# Patient Record
Sex: Male | Born: 1975 | Race: White | Hispanic: No | Marital: Married | Smoking: Never smoker
Health system: Southern US, Community
[De-identification: ages and names within clinical notes are randomized; demographics above are authoritative.]

---

## 2000-07-05 ENCOUNTER — Ambulatory Visit (HOSPITAL_COMMUNITY): Admission: RE | Admit: 2000-07-05 | Discharge: 2000-07-05 | Payer: Self-pay | Admitting: Orthopedic Surgery

## 2000-07-05 ENCOUNTER — Encounter: Payer: Self-pay | Admitting: Orthopedic Surgery

## 2020-02-07 ENCOUNTER — Other Ambulatory Visit: Payer: Self-pay

## 2020-02-07 DIAGNOSIS — R7401 Elevation of levels of liver transaminase levels: Secondary | ICD-10-CM | POA: Insufficient documentation

## 2020-02-07 DIAGNOSIS — R1013 Epigastric pain: Secondary | ICD-10-CM | POA: Insufficient documentation

## 2020-02-07 DIAGNOSIS — K7581 Nonalcoholic steatohepatitis (NASH): Secondary | ICD-10-CM | POA: Insufficient documentation

## 2020-02-07 NOTE — ED Triage Notes (Signed)
Patient began having abdominal pain Monday, no BM since Tuesday. Patient saw his PCP this morning, and he was referred to GI. Patient states he could not wait for that appointment. Patient complaining of epigastric pain, but is holding his lower abdomen also. No N/V, has not taken anything.

## 2020-02-08 ENCOUNTER — Emergency Department (HOSPITAL_COMMUNITY): Payer: Self-pay

## 2020-02-08 ENCOUNTER — Emergency Department (HOSPITAL_COMMUNITY)
Admission: EM | Admit: 2020-02-08 | Discharge: 2020-02-08 | Disposition: A | Discharge status: Home / Self Care | Payer: Self-pay | Attending: Emergency Medicine | Admitting: Emergency Medicine

## 2020-02-08 ENCOUNTER — Encounter (HOSPITAL_COMMUNITY): Payer: Self-pay | Admitting: Emergency Medicine

## 2020-02-08 DIAGNOSIS — R1013 Epigastric pain: Secondary | ICD-10-CM

## 2020-02-08 DIAGNOSIS — R7401 Elevation of levels of liver transaminase levels: Secondary | ICD-10-CM

## 2020-02-08 DIAGNOSIS — K7581 Nonalcoholic steatohepatitis (NASH): Secondary | ICD-10-CM

## 2020-02-08 LAB — URINALYSIS, ROUTINE W REFLEX MICROSCOPIC
Bacteria, UA: NONE SEEN
Bilirubin Urine: NEGATIVE
Glucose, UA: 50 mg/dL — AB
Hgb urine dipstick: NEGATIVE
Ketones, ur: 5 mg/dL — AB
Leukocytes,Ua: NEGATIVE
Nitrite: NEGATIVE
Protein, ur: 30 mg/dL — AB
Specific Gravity, Urine: 1.015 (ref 1.005–1.030)
pH: 6 (ref 5.0–8.0)

## 2020-02-08 LAB — CBC
HCT: 47.5 % (ref 39.0–52.0)
Hemoglobin: 15.8 g/dL (ref 13.0–17.0)
MCH: 28.1 pg (ref 26.0–34.0)
MCHC: 33.3 g/dL (ref 30.0–36.0)
MCV: 84.4 fL (ref 80.0–100.0)
Platelets: 150 10*3/uL (ref 150–400)
RBC: 5.63 MIL/uL (ref 4.22–5.81)
RDW: 13.2 % (ref 11.5–15.5)
WBC: 13.3 10*3/uL — ABNORMAL HIGH (ref 4.0–10.5)
nRBC: 0 % (ref 0.0–0.2)

## 2020-02-08 LAB — COMPREHENSIVE METABOLIC PANEL
ALT: 512 U/L — ABNORMAL HIGH (ref 0–44)
AST: 92 U/L — ABNORMAL HIGH (ref 15–41)
Albumin: 4.5 g/dL (ref 3.5–5.0)
Alkaline Phosphatase: 164 U/L — ABNORMAL HIGH (ref 38–126)
Anion gap: 9 (ref 5–15)
BUN: 5 mg/dL — ABNORMAL LOW (ref 6–20)
CO2: 28 mmol/L (ref 22–32)
Calcium: 8.8 mg/dL — ABNORMAL LOW (ref 8.9–10.3)
Chloride: 100 mmol/L (ref 98–111)
Creatinine, Ser: 1.03 mg/dL (ref 0.61–1.24)
GFR, Estimated: >60 mL/min (ref >60)
Glucose, Bld: 164 mg/dL — ABNORMAL HIGH (ref 70–99)
Potassium: 4.1 mmol/L (ref 3.5–5.1)
Sodium: 137 mmol/L (ref 135–145)
Total Bilirubin: 1.5 mg/dL — ABNORMAL HIGH (ref 0.3–1.2)
Total Protein: 8.1 g/dL (ref 6.5–8.1)

## 2020-02-08 LAB — HEPATITIS PANEL, ACUTE
HCV Ab: NONREACTIVE
Hep A IgM: NONREACTIVE
Hep B C IgM: NONREACTIVE
Hepatitis B Surface Ag: NONREACTIVE

## 2020-02-08 LAB — LIPASE, BLOOD: Lipase: 27 U/L (ref 11–51)

## 2020-02-08 MED ORDER — MORPHINE SULFATE (PF) 4 MG/ML IV SOLN
4.0000 mg | Freq: Once | INTRAVENOUS | Status: AC
  Administered 2020-02-08: 4 mg via INTRAVENOUS
  Filled 2020-02-08: qty 1

## 2020-02-08 MED ORDER — SODIUM CHLORIDE (PF) 0.9 % IJ SOLN
INTRAMUSCULAR | Status: AC
  Filled 2020-02-08: qty 50

## 2020-02-08 MED ORDER — ALUM & MAG HYDROXIDE-SIMETH 200-200-20 MG/5ML PO SUSP
30.0000 mL | Freq: Once | ORAL | Status: AC
  Administered 2020-02-08: 30 mL via ORAL
  Filled 2020-02-08: qty 30

## 2020-02-08 MED ORDER — TRAMADOL HCL 50 MG PO TABS
50.0000 mg | ORAL_TABLET | Freq: Four times a day (QID) | ORAL | 0 refills | Status: AC | PRN
Start: 2020-02-08 — End: ?

## 2020-02-08 MED ORDER — MORPHINE SULFATE (PF) 4 MG/ML IV SOLN
6.0000 mg | Freq: Once | INTRAVENOUS | Status: AC
  Administered 2020-02-08: 6 mg via INTRAVENOUS
  Filled 2020-02-08: qty 2

## 2020-02-08 MED ORDER — SODIUM CHLORIDE 0.9 % IV BOLUS
1000.0000 mL | Freq: Once | INTRAVENOUS | Status: AC
  Administered 2020-02-08: 1000 mL via INTRAVENOUS

## 2020-02-08 MED ORDER — OMEPRAZOLE 20 MG PO CPDR: 20.0000 mg | DELAYED_RELEASE_CAPSULE | Freq: Two times a day (BID) | ORAL | 0 refills | Status: AC

## 2020-02-08 MED ORDER — IOHEXOL 300 MG/ML  SOLN
100.0000 mL | Freq: Once | INTRAMUSCULAR | Status: AC | PRN
  Administered 2020-02-08: 100 mL via INTRAVENOUS

## 2020-02-08 MED ORDER — LIDOCAINE VISCOUS HCL 2 % MT SOLN
15.0000 mL | Freq: Once | OROMUCOSAL | Status: AC
  Administered 2020-02-08: 15 mL via ORAL
  Filled 2020-02-08: qty 15

## 2020-02-08 NOTE — ED Provider Notes (Signed)
[facility] DEPT Provider Note   CSN: [id number] Arrival date & time: 02/07/20  2348     History Chief Complaint  Patient presents with  . Abdominal Pain    Raymond Rush is a 44 y.o. male.  The history is provided by the patient. No language interpreter was used.  Abdominal Pain    44 year old male without significant past medical history presenting for evaluation of abdominal pain.  Patient report intermittent epigastric abdominal pain ongoing for the past 5 days.  Pain is described as a throbbing pulsating sensation which has been waxing and waning however has increased in frequency and intensity prompting this ER visit.  Pain is localized nonradiating without any associated fever chills no chest pain or shortness of breath no cough no nausea no vomiting no dysuria.  He did report decrease in bowel movement than usual.  He did notice some darker stools several days ago.  He denies any recent Tylenol use.  He did see his PCP this morning and was given referral to gastroenterology but due to worsening pain he decided to come here.  He has not been vaccinated for COVID-19.  He denies alcohol use or tobacco use.  He denies any drug use.  Pain is now constant rates at 6 out of 10.  History reviewed. No pertinent past medical history.  There are no problems to display for this patient.   History reviewed. No pertinent surgical history.     No family history on file.  Social History   Tobacco Use  . Smoking status: Never Smoker  . Smokeless tobacco: Never Used  Substance Use Topics  . Alcohol use: Yes  . Drug use: Not Currently    Home Medications Prior to Admission medications   Not on File    Allergies    Patient has no known allergies.  Review of Systems   Review of Systems  Gastrointestinal: Positive for abdominal pain.  All other systems reviewed and are negative.   Physical Exam Updated Vital Signs BP (!) 161/101 (BP Location:  Right Arm)   Pulse 62   Temp 98.2 F (36.8 C) (Oral)   Resp 16   SpO2 96%   Physical Exam Vitals and nursing note reviewed.  Constitutional:      General: He is not in acute distress.    Appearance: He is well-developed.  HENT:     Head: Atraumatic.  Eyes:     Conjunctiva/sclera: Conjunctivae normal.  Cardiovascular:     Rate and Rhythm: Normal rate and regular rhythm.     Heart sounds: Normal heart sounds.  Pulmonary:     Effort: Pulmonary effort is normal.     Breath sounds: Normal breath sounds.  Abdominal:     General: Abdomen is flat. Bowel sounds are normal.     Palpations: Abdomen is soft.     Tenderness: There is abdominal tenderness in the epigastric area. There is no right CVA tenderness, left CVA tenderness, guarding or rebound. Negative signs include Murphy's sign, Rovsing's sign and McBurney's sign.     Hernia: No hernia is present.  Musculoskeletal:     Cervical back: Neck supple.  Skin:    Findings: No rash.  Neurological:     Mental Status: He is alert and oriented to person, place, and time.  Psychiatric:        Mood and Affect: Mood normal.     ED Results / Procedures / Treatments   Labs (all labs ordered are listed,  but only abnormal results are displayed) Labs Reviewed  COMPREHENSIVE METABOLIC PANEL - Abnormal; Notable for the following components:      Result Value   Glucose, Bld 164 (*)    BUN 5 (*)    Calcium 8.8 (*)    AST 92 (*)    ALT 512 (*)    Alkaline Phosphatase 164 (*)    Total Bilirubin 1.5 (*)    All other components within normal limits  CBC - Abnormal; Notable for the following components:   WBC 13.3 (*)    All other components within normal limits  URINALYSIS, ROUTINE W REFLEX MICROSCOPIC - Abnormal; Notable for the following components:   Glucose, UA 50 (*)    Ketones, ur 5 (*)    Protein, ur 30 (*)    All other components within normal limits  LIPASE, BLOOD  HEPATITIS PANEL, ACUTE    EKG None  Radiology CT  ABDOMEN PELVIS W CONTRAST  Result Date: 02/08/2020 CLINICAL DATA:  Epigastric pain EXAM: CT ABDOMEN AND PELVIS WITH CONTRAST TECHNIQUE: Multidetector CT imaging of the abdomen and pelvis was performed using the standard protocol following bolus administration of intravenous contrast. CONTRAST:  132m OMNIPAQUE IOHEXOL 300 MG/ML  SOLN COMPARISON:  None. FINDINGS: Lower chest: No acute abnormality Hepatobiliary: Mild diffuse fatty infiltration of the liver. No focal hepatic abnormality. Gallbladder unremarkable. Pancreas: No focal hepatic abnormality.  Gallbladder unremarkable. Spleen: Splenomegaly with a craniocaudal length of 15.6 cm. Adrenals/Urinary Tract: No adrenal abnormality. No focal renal abnormality. No stones or hydronephrosis. Urinary bladder is unremarkable. Stomach/Bowel: Stomach, large and small bowel grossly unremarkable. Vascular/Lymphatic: No evidence of aneurysm or adenopathy. Reproductive: No visible focal abnormality. Other: No free fluid or free air. Musculoskeletal: No acute bony abnormality. Fusion across the right SI joint. IMPRESSION: Diffuse fatty infiltration of the liver. Splenomegaly. Electronically Signed   By: KRolm BaptiseM.D.   On: 02/08/2020 03:40    Procedures Procedures (including critical care time)  Medications Ordered in ED Medications  sodium chloride 0.9 % bolus 1,000 mL (1,000 mLs Intravenous New Bag/Given (Non-Interop) 02/08/20 0237)  morphine 4 MG/ML injection 4 mg (4 mg Intravenous Given 02/08/20 0238)  alum & mag hydroxide-simeth (MAALOX/MYLANTA) 200-200-20 MG/5ML suspension 30 mL (30 mLs Oral Given 02/08/20 0238)    And  lidocaine (XYLOCAINE) 2 % viscous mouth solution 15 mL (15 mLs Oral Given 02/08/20 0238)  sodium chloride (PF) 0.9 % injection (  Given 02/08/20 0520)  iohexol (OMNIPAQUE) 300 MG/ML solution 100 mL (100 mLs Intravenous Contrast Given 02/08/20 0323)  morphine 4 MG/ML injection 6 mg (6 mg Intravenous Given 02/08/20 0517)    ED Course  I have  reviewed the triage vital signs and the nursing notes.  Pertinent labs & imaging results that were available during my care of the patient were reviewed by me and considered in my medical decision making (see chart for details).    MDM Rules/Calculators/A&P                          BP (!) 159/105   Pulse (!) 51   Temp 98.2 F (36.8 C) (Oral)   Resp 20   SpO2 95%   Final Clinical Impression(s) / ED Diagnoses Final diagnoses:  Epigastric abdominal pain  Transaminitis  NASH (nonalcoholic steatohepatitis)    Rx / DC Orders ED Discharge Orders         Ordered    traMADol (ULTRAM) 50 MG tablet  Every 6  hours PRN        02/08/20 0610    omeprazole (PRILOSEC) 20 MG capsule  2 times daily before meals        02/08/20 0610         2:18 AM Patient here with epigastric abdominal pain That has become progressively worse for the past few days.  He does have reproducible tenderness on exam without any peritoneal sign.  Labs remarkable for an elevated white count of 13.3, transaminitis with AST 92, ALT 512, alk phos 164, and total bili of 1.5.  Normal lipase, urine without any signs of urine tract infection.  Plan to obtain abdominal pelvis CT scan for evaluation.  Pain medication given.  5:05 AM Abdominal pelvis CT scan did not show any acute finding.  Evidence of diffuse fatty infiltration of the liver were noted along with splenomegaly.  Patient denies alcohol use, denies Tylenol use, no history of IV drug use or any recent medication changes.  No report of concerning risky sexual behaviors.  He does not have a PCP.  He did receive a GI cocktail along with 4 mg of morphine but report minimal improvement of his symptoms.  Additional pain medication given, hepatitis panel is currently pending. Care discussed with Dr. Dina Rich.   6:07 AM At this time patient is feeling comfortable and pain much improved. Hepatitis panel is still pending. However at this time patient is stable for discharge with  close follow-up outpatient with GI specialist for recheck of his liver function as well as further assessments of his abdominal pain. I also provide resource to find a primary care provider. Strict return precaution discussed. Will discharge home with symptomatic treatment.   Domenic Moras, PA-C 02/08/20 1749    Merryl Hacker, MD 02/09/20 [phone]

## 2020-02-08 NOTE — Discharge Instructions (Signed)
You have been evaluated for your abdominal pain. Your liver function value is elevated today. Please call and follow-up closely with GI specialist with your primary care provider next week for recheck of your liver function and further assessment of your abdominal pain. Take medication prescribed as needed but please be aware that tramadol may cause drowsiness. If you develop worsening pain nausea vomiting fever or if you have any other concern please do not hesitate to return to the ER for further evaluation. Your hepatitis panel is currently pending. You may also follow-up on the results through MyChart, link below in the next day or 2.

## 2022-03-05 IMAGING — CT CT ABD-PELV W/ CM
2 of 5 series · 16 of 46 positions shown, 18 images · IV contrast (omnipaque)
Comparison: None.

CLINICAL DATA: Epigastric pain

EXAM:
CT ABDOMEN AND PELVIS WITH CONTRAST
TECHNIQUE: Multidetector CT imaging of the abdomen and pelvis was performed
using the standard protocol following bolus administration of
intravenous contrast.
CONTRAST:  100mL OMNIPAQUE IOHEXOL 300 MG/ML  SOLN

[Series 2: axial st · axial · 0.83mm/px · z∈[-551,-66]mm · 13 of 113 slices shown, 15 images]
[im 8/113  soft-tissue]
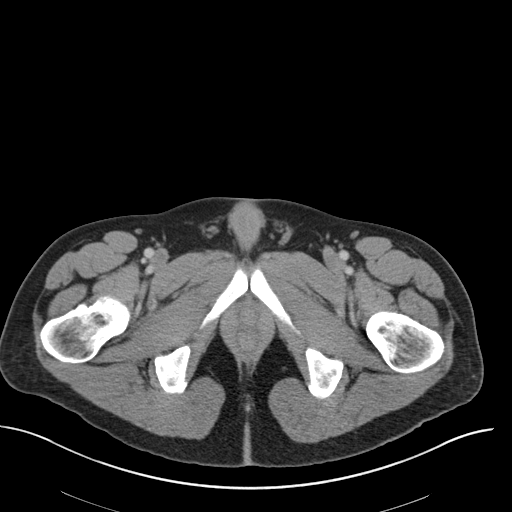
[im 8/113  bone]
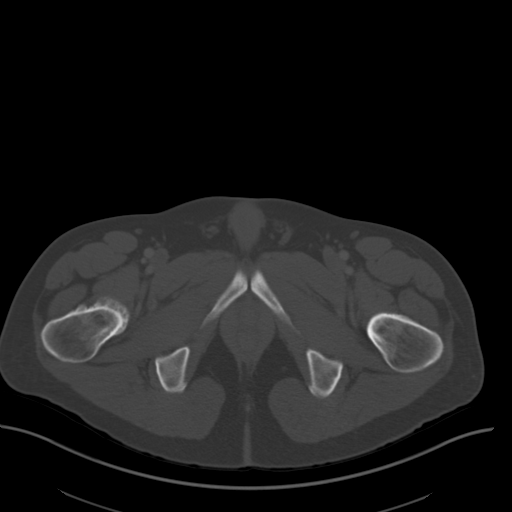
[im 15/113  soft-tissue]
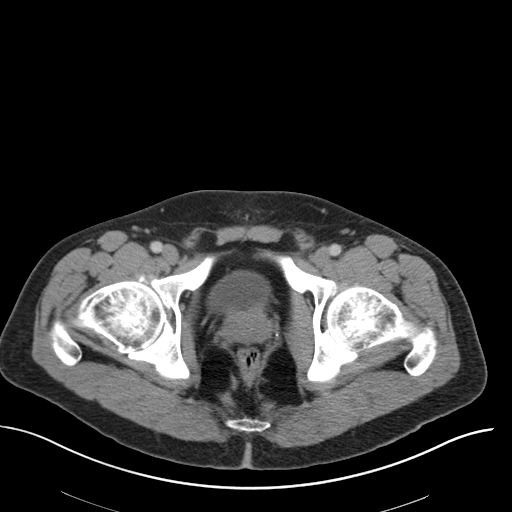
[im 23/113  soft-tissue]
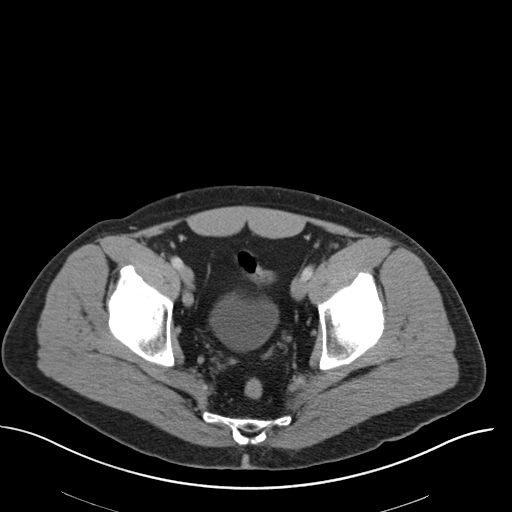
[im 30/113  soft-tissue]
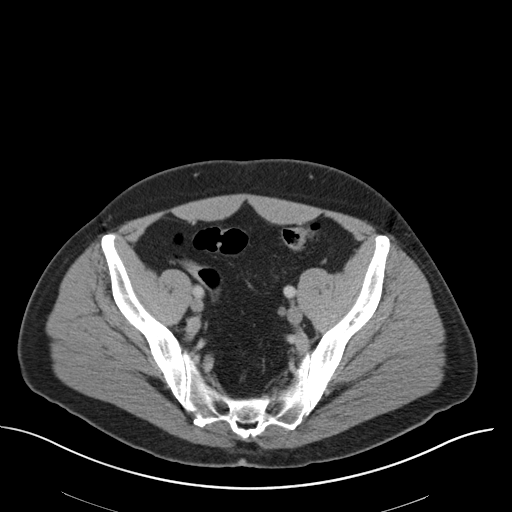
[im 38/113  soft-tissue]
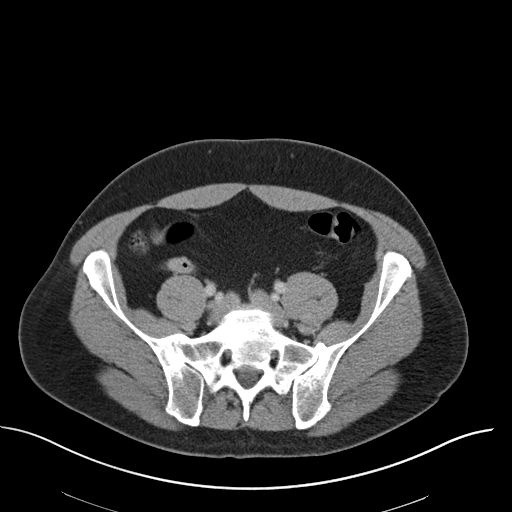
[im 45/113  soft-tissue]
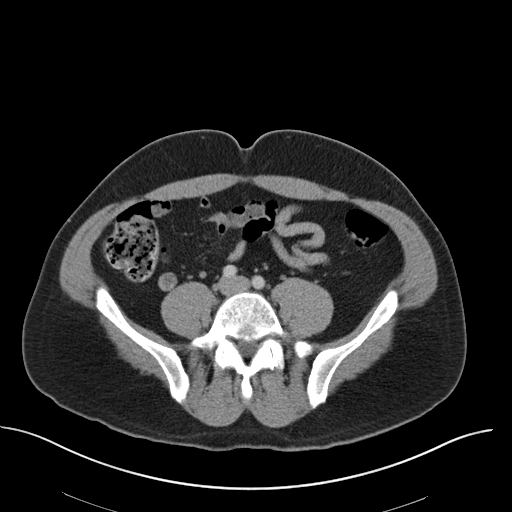
[im 60/113  soft-tissue]
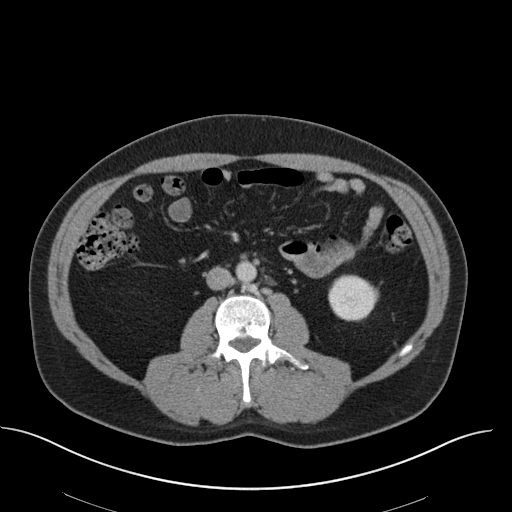
[im 68/113  soft-tissue]
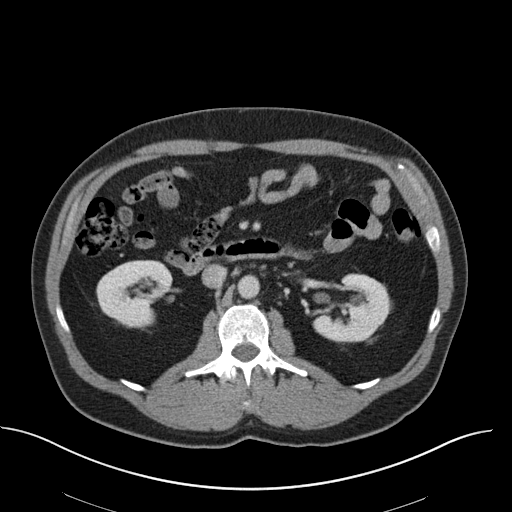
[im 75/113  soft-tissue]
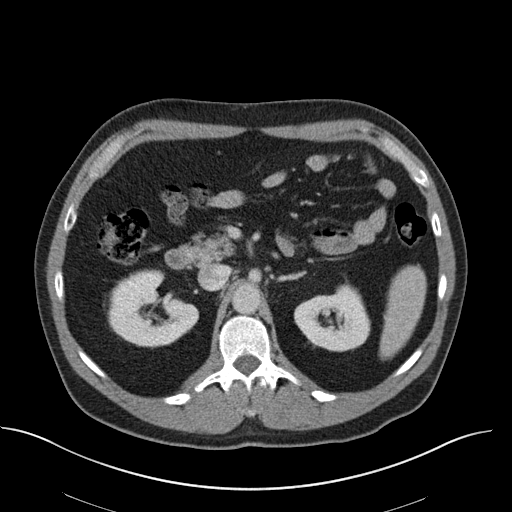
[im 75/113  bone]
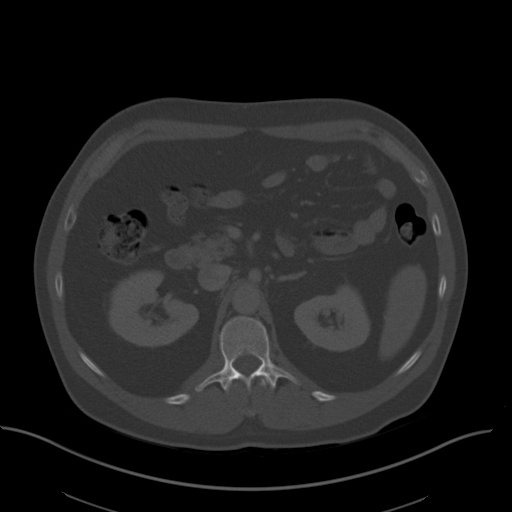
[im 83/113  soft-tissue]
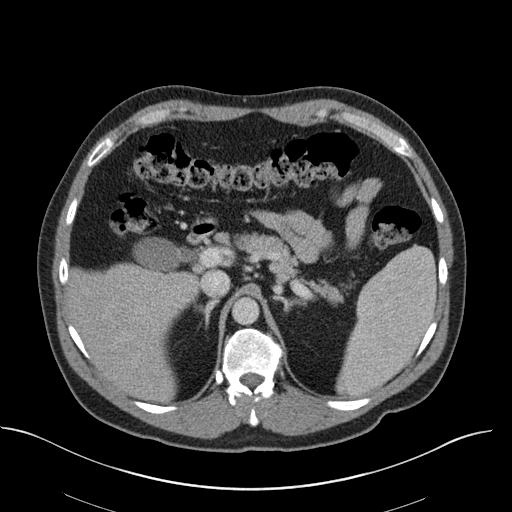
[im 90/113  soft-tissue]
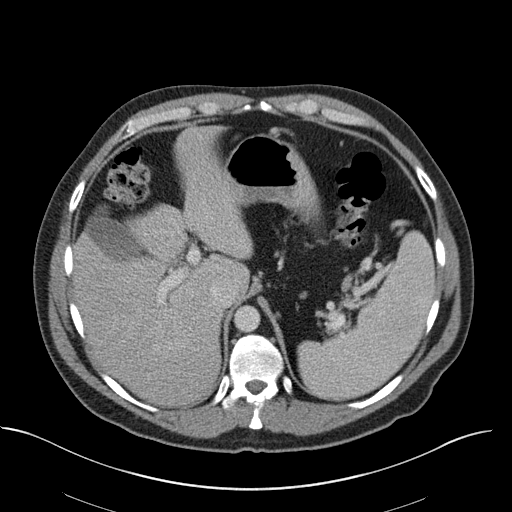
[im 98/113  soft-tissue]
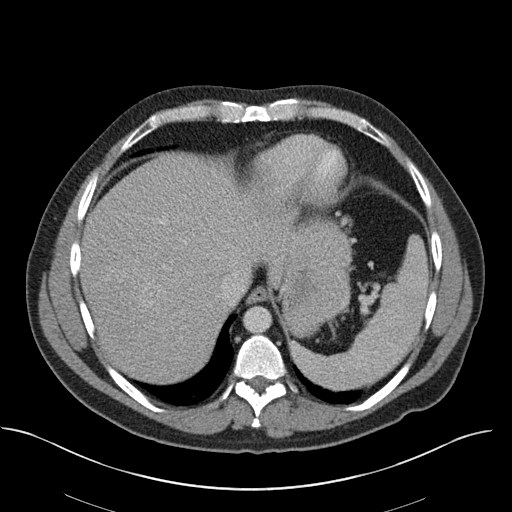
[im 105/113  soft-tissue]
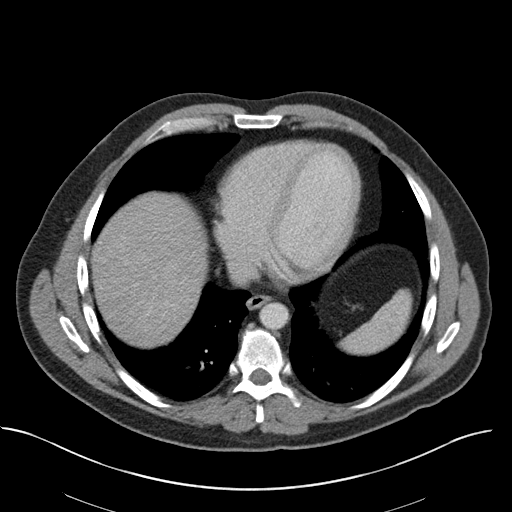

[Series 5: coronal st · coronal · 0.82mm/px · 3 of 159 slices shown]
[im 53/159  soft-tissue]
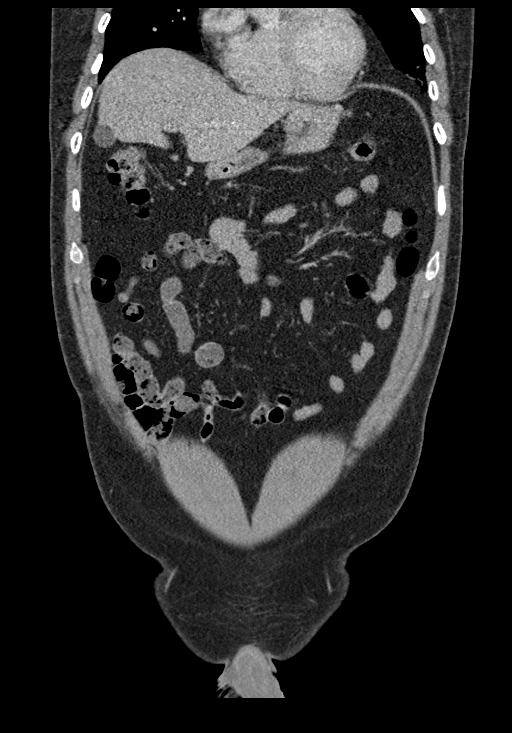
[im 71/159  soft-tissue]
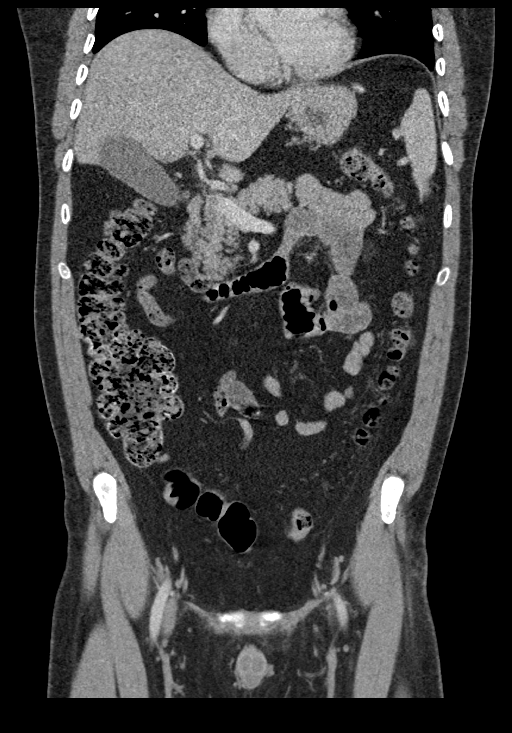
[im 88/159  soft-tissue]
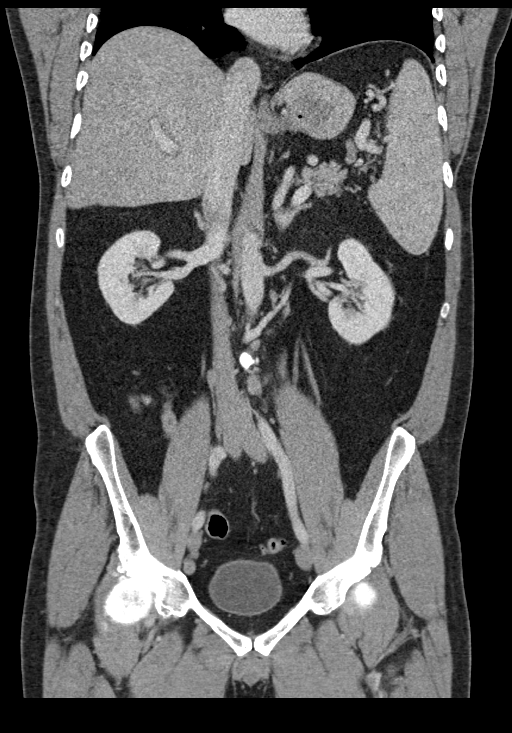

[16 of 46 positions shown; findings below may reference images not displayed]

FINDINGS: Lower chest: No acute abnormality

Hepatobiliary: Mild diffuse fatty infiltration of the liver. No
focal hepatic abnormality. Gallbladder unremarkable.

Pancreas: No focal hepatic abnormality.  Gallbladder unremarkable.

Spleen: Splenomegaly with a craniocaudal length of 15.6 cm.

Adrenals/Urinary Tract: No adrenal abnormality. No focal renal
abnormality. No stones or hydronephrosis. Urinary bladder is
unremarkable.

Stomach/Bowel: Stomach, large and small bowel grossly unremarkable.

Vascular/Lymphatic: No evidence of aneurysm or adenopathy.

Reproductive: No visible focal abnormality.

Other: No free fluid or free air.

Musculoskeletal: No acute bony abnormality. Fusion across the right
SI joint.
IMPRESSION: Diffuse fatty infiltration of the liver.

Splenomegaly.
